# Patient Record
Sex: Male | Born: 1950 | Hispanic: No | Marital: Single
Health system: Southern US, Community
[De-identification: ages and names within clinical notes are randomized; demographics above are authoritative.]

---

## 1997-10-05 ENCOUNTER — Ambulatory Visit (HOSPITAL_COMMUNITY): Admission: RE | Admit: 1997-10-05 | Discharge: 1997-10-05 | Payer: Self-pay | Admitting: Family Medicine

## 1997-10-06 ENCOUNTER — Ambulatory Visit (HOSPITAL_COMMUNITY): Admission: RE | Admit: 1997-10-06 | Discharge: 1997-10-06 | Payer: Self-pay | Admitting: Family Medicine

## 2003-11-23 ENCOUNTER — Encounter: Admission: RE | Admit: 2003-11-23 | Discharge: 2003-11-23 | Payer: Self-pay | Admitting: Family Medicine

## 2005-10-02 IMAGING — CR DG KNEE 1-2V*L*
2 series · 2 of 2 positions shown · non-contrast
Comparison: none

CLINICAL DATA: Pain and swelling.  No known injury.
 LEFT KNEE
 Two views of the left knee were obtained.  There is only minimal joint space medially and laterally and at the patellofemoral articulation.  No acute bony abnormality is seen.  There may be a small knee joint effusion present. 
 IMPRESSION
 Only mild degenerative change.  Cannot exclude small knee joint effusion.

[view not recorded (1 of 2)]
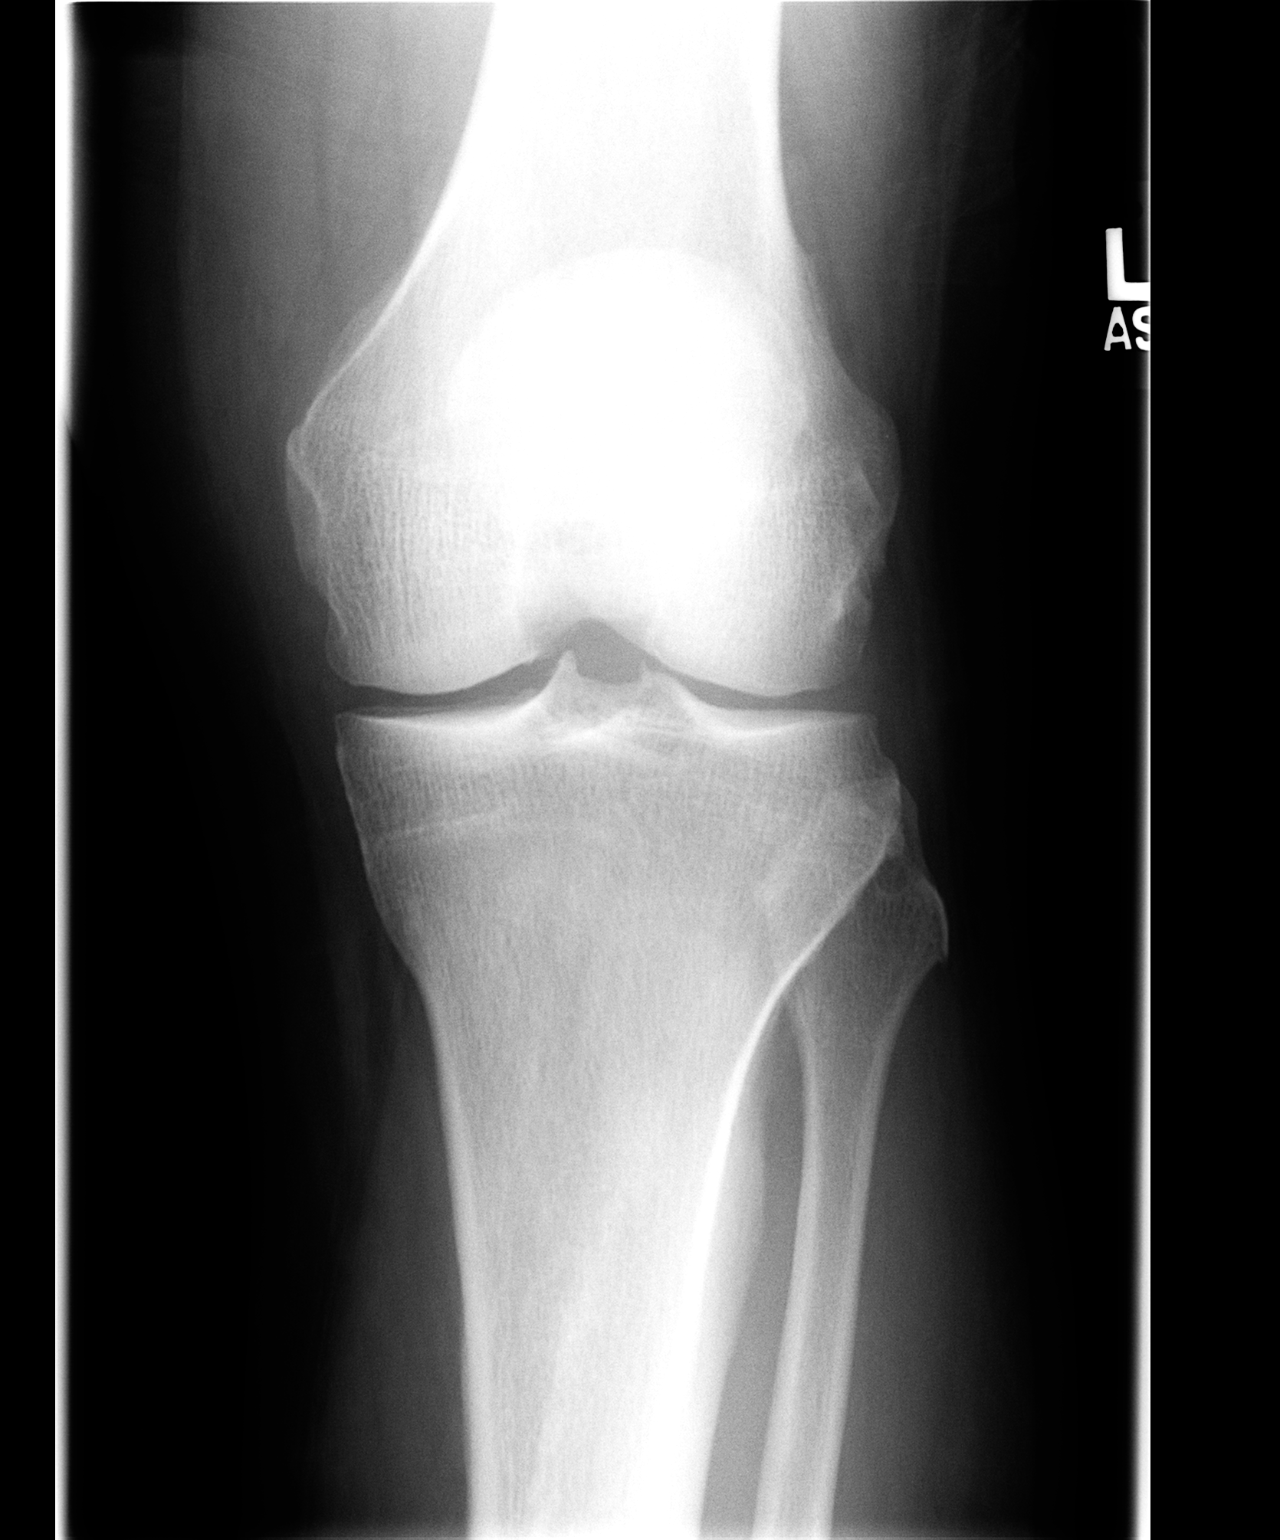

[view not recorded (2 of 2)]
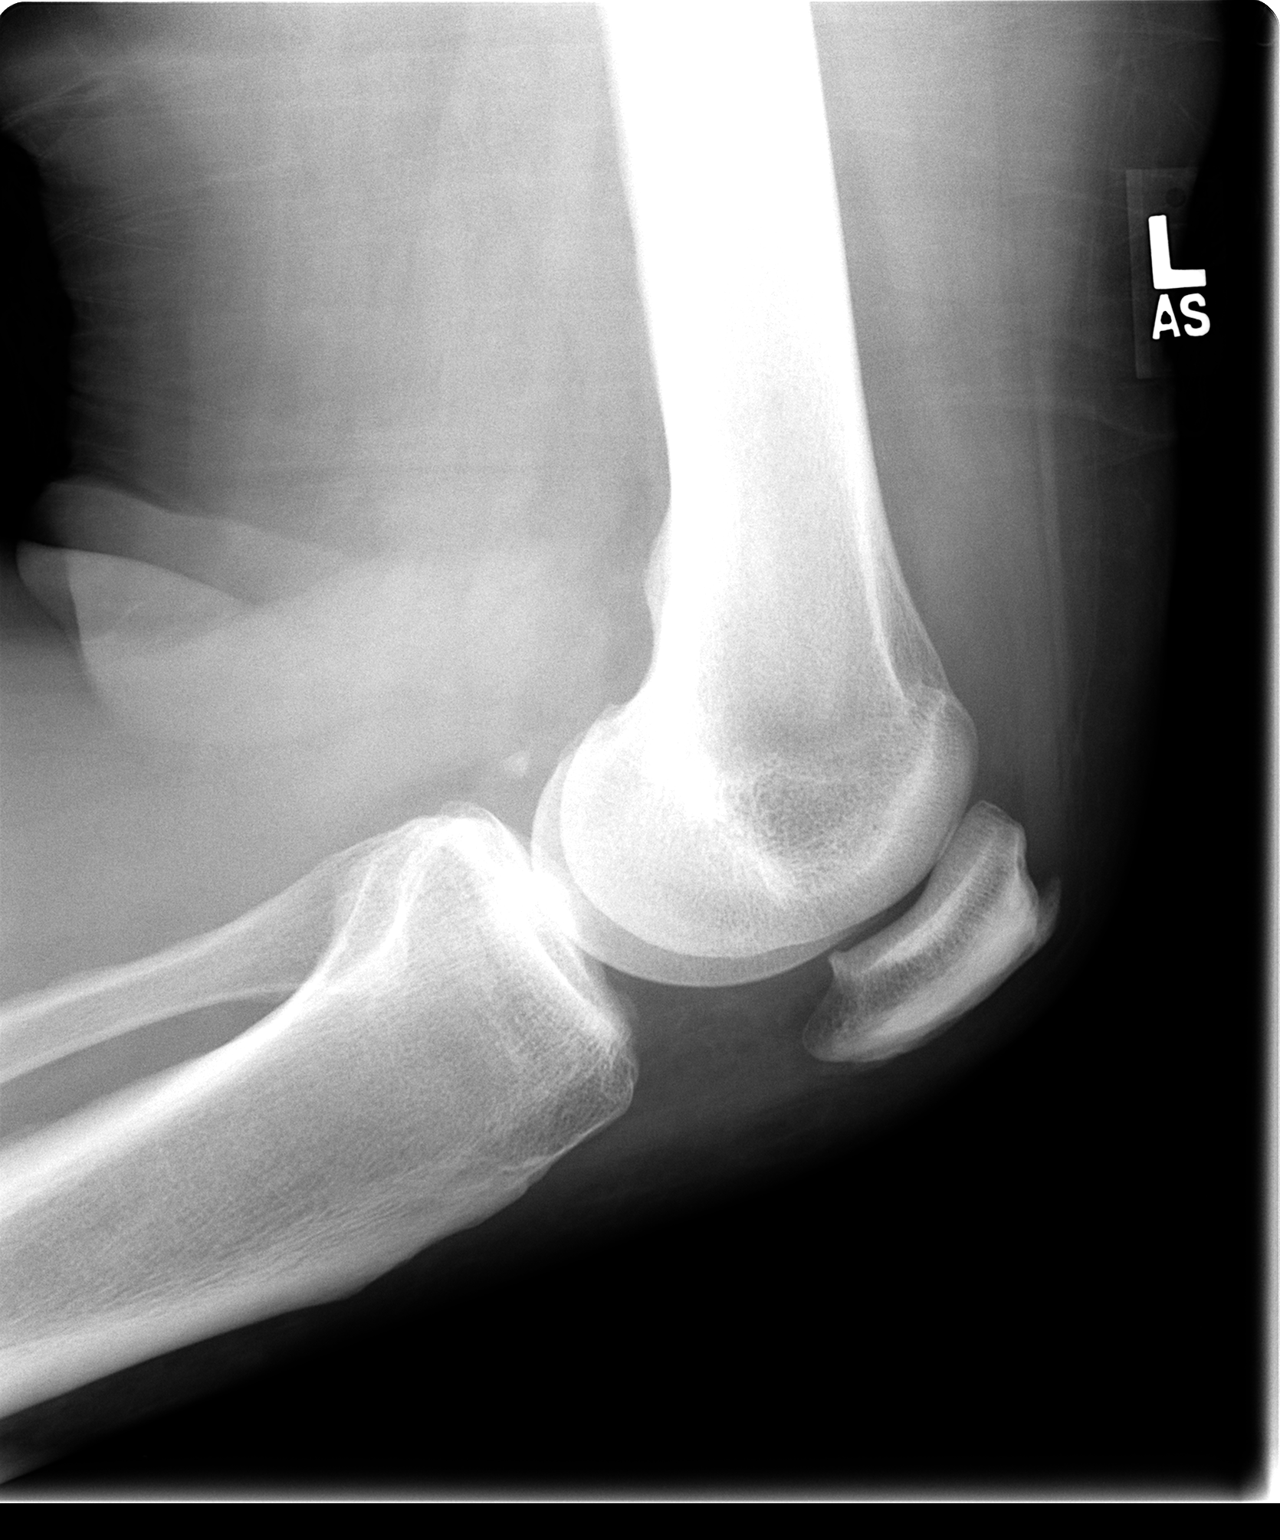

[2 of 2 positions shown; findings below may reference images not displayed]

## 2008-08-08 ENCOUNTER — Emergency Department (HOSPITAL_COMMUNITY): Admission: EM | Admit: 2008-08-08 | Discharge: 2008-08-08 | Payer: Self-pay | Admitting: Emergency Medicine

## 2016-01-06 ENCOUNTER — Other Ambulatory Visit: Payer: Self-pay | Admitting: *Deleted

## 2016-01-06 ENCOUNTER — Ambulatory Visit (INDEPENDENT_AMBULATORY_CARE_PROVIDER_SITE_OTHER): Payer: Medicare Other | Admitting: Neurology

## 2016-01-06 DIAGNOSIS — G5622 Lesion of ulnar nerve, left upper limb: Secondary | ICD-10-CM

## 2016-01-06 DIAGNOSIS — R200 Anesthesia of skin: Secondary | ICD-10-CM

## 2016-01-06 NOTE — Procedures (Signed)
[facility]  [address], [address]  [place], [place] [address] Tel: [phone] Fax:  [phone] Test Date:  01/06/2016  Patient: Nicholas Mcmillan DOB: 1951-03-02 Physician: Nita Sickleonika Xiong Haidar, DO  Sex: Male Height: 6\' 3"  Ref Phys: Lupe Carneyean Mitchell. MD  ID#: [id number] Temp: 32.3C Technician: M. Dean   Patient Complaints: This is a 65 year old gentleman referred for evaluation of left hand numbness, especially over the fifth digit.   NCV & EMG Findings: Extensive electrodiagnostic testing of the left upper extremity shows:  1. Left median and dorsal ulnar cutaneous sensory response is within normal limits. Left ulnar sensory response shows mildly prolonged latency (3.6). 2. Left median motor responses within normal limits. Left ulnar motor response at the abductor digiti minimi and first dorsal interosseous muscle shows conduction velocity slowing across the elbow; additionally, the onset latency recording at the abductor digiti minimi is also mildly prolonged (3.3 ms).  3. Rapid recruitment pattern is seen involving the ulnar innervated muscles, without changes in motor unit configuration. There is no evidence of fibrillation potentials.   Impression: 1. 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 2. There is no evidence of a cervical radiculopathy or carpal tunnel syndrome affecting the left upper extremity.   ___________________________ Nita Sickleonika Contina Strain, DO    Nerve Conduction Studies Anti Sensory Summary Table   Stim Site NR Peak (ms) Norm Peak (ms) P-T Amp (V) Norm P-T Amp  Left DorsCutan Anti Sensory (Dorsum 5th MC)  32.3C  Wrist    2.7 <3.2 6.3 >5  Left Median Anti Sensory (2nd Digit)  32.3C  Wrist    3.6 <3.8 11.9 >10  Left Ulnar Anti Sensory (5th Digit)  32.3C  Wrist    3.6 <3.2 16.8 >5   Motor Summary Table   Stim Site NR Onset (ms) Norm Onset (ms) O-P Amp (mV) Norm O-P Amp Site1 Site2 Delta-0 (ms) Dist (cm) Vel  (m/s) Norm Vel (m/s)  Left Median Motor (Abd Poll Brev)  32.3C  Wrist    3.8 <4.0 8.2 >5 Elbow Wrist 6.2 32.0 52 >50  Elbow    10.0  7.5         Left Ulnar Motor (Abd Dig Minimi)  32.3C  Wrist    3.3 <3.1 8.1 >7 B Elbow Wrist 5.4 28.0 52 >50  B Elbow    8.7  7.9  A Elbow B Elbow 2.2 10.0 45 >50  A Elbow    10.9  7.6         Left Ulnar (FDI) Motor (1st DI)  32.3C  Wrist    4.1 <4.5 11.3 >7 B Elbow Wrist 5.0 27.0 54 >50  B Elbow    9.1  10.4  A Elbow B Elbow 2.3 10.0 43 >50  A Elbow    11.4  10.3          EMG   Side Muscle Ins Act Fibs Psw Fasc Number Recrt Dur Dur. Amp Amp. Poly Poly. Comment  Left 1stDorInt Nml Nml Nml Nml 1- Mod-R Nml Nml Nml Nml Nml Nml N/A  Left ABD Dig Min Nml Nml Nml Nml 1- Mod-R Nml Nml Nml Nml Nml Nml N/A  Left Ext Indicis Nml Nml Nml Nml Nml Nml Nml Nml Nml Nml Nml Nml N/A  Left PronatorTeres Nml Nml Nml Nml Nml Nml Nml Nml Nml Nml Nml Nml N/A  Left Biceps Nml Nml Nml Nml Nml Nml Nml Nml Nml Nml Nml Nml N/A  Left Triceps  Nml Nml Nml Nml Nml Nml Nml Nml Nml Nml Nml Nml N/A  Left Deltoid Nml Nml Nml Nml Nml Nml Nml Nml Nml Nml Nml Nml N/A  Left FlexDigProf 4,5 Nml Nml Nml Nml 2- Rapid Nml Nml Nml Nml Nml Nml N/A  Left FlexCarpiUln Nml Nml Nml Nml 1- Mod-R Nml Nml Nml Nml Nml Nml N/A      Waveforms:

## 2016-02-08 ENCOUNTER — Emergency Department (HOSPITAL_COMMUNITY)
Admission: EM | Admit: 2016-02-08 | Discharge: 2016-02-11 | Disposition: E | Payer: Medicare Other | Attending: Emergency Medicine | Admitting: Emergency Medicine

## 2016-02-08 ENCOUNTER — Encounter (HOSPITAL_COMMUNITY): Payer: Self-pay | Admitting: Emergency Medicine

## 2016-02-08 DIAGNOSIS — I469 Cardiac arrest, cause unspecified: Secondary | ICD-10-CM | POA: Diagnosis not present

## 2016-02-11 NOTE — ED Notes (Signed)
[facility] called. Referral # [id number], [facility]

## 2016-02-11 NOTE — ED Provider Notes (Signed)
[facility]EM[facility]ENCY DEPT Provider Note   CSN: [id number] Arrival date & time: 2015/11/24  1914     History   Chief Complaint Chief Complaint  Patient presents with  . Cardiac Arrest    HPI Nicholas Mcmillan is a 65 y.o. male.  HPI Patient is a 65 year old male who presents via EMS as a cardiac arrest. Unable to obtain history from patient as he is intubated intended. Further history obtained from EMS report. EMS reports that patient began vomiting and then went unresponsive. EMS was called and found patient in PEA arrest CPR started at 1719. He received CPR for about 30 minutes. ROSC was briefly achieved and the airway was placed. However patient lost pulses again about 45 minutes prior to arrival. Patient arrived in the emergency department with active CPR in progress. An Epi drip was running.   Marland Kitchen. History reviewed. No pertinent past medical history.  There are no active problems to display for this patient.   History reviewed. No pertinent surgical history.     Home Medications    Prior to Admission medications   Not on File    Family History No family history on file.  Social History Social History  Substance Use Topics  . Smoking status: Unknown If Ever Smoked  . Smokeless tobacco: Never Used  . Alcohol use No     Allergies   Patient has no known allergies.   Review of Systems Review of Systems  Unable to perform ROS: Patient unresponsive     Physical Exam Updated Vital Signs Temp (!) 96.1 F (35.6 C) (Axillary)   Wt 113.4 kg   Physical Exam  Constitutional:  Unresponsive, King Airway in place.   Cardiovascular:  No palpable pulses.  Pulmonary/Chest:  King Airway in place. No spontaneous respirations.   Neurological:  Unresponsive. No spontaneous movements.      ED Treatments / Results  Labs (all labs ordered are listed, but only abnormal results are displayed) Labs Reviewed - No data to display  EKG  EKG Interpretation None        Radiology No results found.  Procedures Procedures (including critical care time)  Medications Ordered in ED Medications - No data to display   Initial Impression / Assessment and Plan / ED Course  I have reviewed the triage vital signs and the nursing notes.  Pertinent labs & imaging results that were available during my care of the patient were reviewed by me and considered in my medical decision making (see chart for details).  Clinical Course     Patient is a 65 year old male with apparent history of "left arm circulation issues" he presents to EMS receiving CPR. CPR was started at 1719. Patient briefly regained pulses, but CPR was resumed about 45 minutes prior to arrival. On arrival patient, remained unresponsive, King airway in place with absent pulses and asystole on rhythm check. No cardiac activity on bedside ultrasound. At this time, patient has had a down time of at least 45 minutes since last pulses and initial arrest was 2 hours ago. Decision was made to stop compressions. Patient remains asystole on the monitor without palpable pulses or spontaneous respirations. Time of death was called at [id number]. I updated patient's wife and daughter. Chaplain was consulted. ME was consulted, but declines patient as an ME case.   Patient seen and discussed with Dr. Jeraldine LootsLockwood, ED attending  Final Clinical Impressions(s) / ED Diagnoses   Final diagnoses:  None    New Prescriptions There are  no discharge medications for this patient.    Isa RankinAnn B Wade Sigala, MD 02/09/16 0136    Gerhard Munchobert Lockwood, MD 02/09/16 [phone]

## 2016-02-11 NOTE — ED Notes (Signed)
Called Medical Examiner

## 2016-02-11 NOTE — Progress Notes (Signed)
   11-14-2015 1942  Clinical Encounter Type  Visited With Family  Visit Type Spiritual support;ED  Referral From Nurse  Consult/Referral To Chaplain  Spiritual Encounters  Spiritual Needs Prayer;Emotional;Grief support  Stress Factors  Family Stress Factors Loss  pt. Spouse explained that pt began vomiting, unresponsive by wife, EMS arrived on scene and began CPR at  Pt did not have pulses. Chaplain consulted with family, wife, and daughter, gave placement specialist card, list of local funeral services, ministry of presence, prayer, emotional support, grief support, bereavement.  [facility] Inc[facility]plain Roylene Heaton  [phone]

## 2016-02-11 NOTE — ED Triage Notes (Signed)
Per EMS, pt began vomiting and went unresponsive by wife, EMS arrived on scene and began CPR at 1719. Initial rhythm PEA, CPR performed for about 30 minutes, ROSC, pt lost pulses again 45 minutes PTA. EMS unsure of amount of Epi given, pt on epi drip upon arrival to this ED. IO placed in left leg and left EJ in place. Per family, only medical history "left arm circulation" issues.

## 2016-02-11 DEATH — deceased
